# Patient Record
Sex: Male | Born: 1985 | Race: White | Hispanic: No | Smoking: Current every day smoker
Health system: Southern US, Community
[De-identification: ages and names within clinical notes are randomized; demographics above are authoritative.]

## PROBLEM LIST (undated history)

## (undated) HISTORY — PX: TIBIA FRACTURE SURGERY: SHX806

---

## 2019-04-24 ENCOUNTER — Ambulatory Visit (HOSPITAL_COMMUNITY)
Admission: EM | Admit: 2019-04-24 | Discharge: 2019-04-24 | Disposition: A | Discharge status: Home / Self Care | Payer: Worker's Compensation | Attending: Family Medicine | Admitting: Family Medicine

## 2019-04-24 ENCOUNTER — Ambulatory Visit (INDEPENDENT_AMBULATORY_CARE_PROVIDER_SITE_OTHER): Payer: Worker's Compensation

## 2019-04-24 ENCOUNTER — Encounter (HOSPITAL_COMMUNITY): Payer: Self-pay

## 2019-04-24 ENCOUNTER — Other Ambulatory Visit: Payer: Self-pay

## 2019-04-24 DIAGNOSIS — S93402A Sprain of unspecified ligament of left ankle, initial encounter: Secondary | ICD-10-CM

## 2019-04-24 MED ORDER — NAPROXEN 500 MG PO TABS: 500.0000 mg | ORAL_TABLET | Freq: Two times a day (BID) | ORAL | 0 refills | Status: AC

## 2019-04-24 NOTE — Discharge Instructions (Signed)
No fracture on xray today, obviously sprained.  Ice, elevation, ace wrap, naproxen twice a day for pain control.  May use crutches as needed.  Activity as tolerated.  Please follow up with sports medicine as needed for persistent symptoms.

## 2019-04-24 NOTE — ED Triage Notes (Signed)
Pt presents to UC w/ left ankle injury. Pt reports he "rolled" it yesterday afternoon.

## 2019-04-24 NOTE — ED Provider Notes (Signed)
[facility]    CSN: [id number] Arrival date & time: 04/24/19  1302      History   Chief Complaint Chief Complaint  Patient presents with   ankle injury    HPI Travis Snow is a 33 y.o. male.   Travis Snow presents with complaints of left ankle pain swelling and bruising after he twisted it as he stepped down off of a lift yesterday at work. Pain and swelling since. No numbness or tingling. Burning sensation. Has applied ice and elevated it, taken ibuprofen, which hasn't helped. Pain 7/10 in severity. Worse with weight bearing. Denies any previous left ankle or foot injury. History of surgery to right ankle. Without contributing medical history.      ROS per HPI, negative if not otherwise mentioned.      History reviewed. No pertinent past medical history.  There are no active problems to display for this patient.   Past Surgical History:  Procedure Laterality Date   TIBIA FRACTURE SURGERY     right       Home Medications    Prior to Admission medications   Medication Sig Start Date End Date Taking? Authorizing Provider  omeprazole (PRILOSEC) 20 MG capsule Take 20 mg by mouth daily.   Yes [provider]  naproxen (NAPROSYN) 500 MG tablet Take 1 tablet (500 mg total) by mouth 2 (two) times daily. 04/24/19   Zigmund Gottron, NP    Family History History reviewed. No pertinent family history.  Social History Social History   Tobacco Use   Smoking status: Current Every Day Smoker    Packs/day: 0.50    Types: Cigarettes   Smokeless tobacco: Never Used  Substance Use Topics   Alcohol use: Never    Frequency: Never   Drug use: Never     Allergies   Patient has no known allergies.   Review of Systems Review of Systems   Physical Exam Triage Vital Signs ED Triage Vitals  Enc Vitals Group     BP 04/24/19 1325 (!) 162/101     Pulse Rate 04/24/19 1325 80     Resp 04/24/19 1325 16     Temp 04/24/19 1325 98.1 F (36.7  C)     Temp Source 04/24/19 1325 Temporal     SpO2 04/24/19 1325 99 %     Weight --      Height --      Head Circumference --      Peak Flow --      Pain Score 04/24/19 1338 7     Pain Loc --      Pain Edu? --      Excl. in [place]? --    No data found.  Updated Vital Signs BP (!) 162/101 (BP Location: Left Arm)    Pulse 80    Temp 98.1 F (36.7 C) (Temporal)    Resp 16    SpO2 99%    Physical Exam Constitutional:      Appearance: He is well-developed.  Cardiovascular:     Rate and Rhythm: Normal rate.  Pulmonary:     Effort: Pulmonary effort is normal.  Musculoskeletal:     Left ankle: He exhibits decreased range of motion, swelling and ecchymosis. He exhibits no laceration and normal pulse. Tenderness. Lateral malleolus tenderness found. Achilles tendon normal.       Feet:     Comments: Swelling and bruising about the anterior as well as primarily the lateral left ankle; tenderness to  lateral malleolus and surrounding soft tissue; strong pedal pulse; cap refill < 2 seconds; moving toes without difficulty; pain with ROM to left ankle   Skin:    General: Skin is warm and dry.  Neurological:     Mental Status: He is alert and oriented to person, place, and time.      UC Treatments / Results  Labs (all labs ordered are listed, but only abnormal results are displayed) Labs Reviewed - No data to display  EKG   Radiology Dg Ankle Complete Left  Result Date: 04/24/2019 CLINICAL DATA:  Twisting injury and fall, pain the lateral malleolus EXAM: LEFT ANKLE COMPLETE - 3+ VIEW COMPARISON:  None. FINDINGS: No fracture or dislocation of the left ankle. 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 There is an incidental small bone island of the distal left tibia. Soft tissue edema overlying the lateral malleolus. IMPRESSION: No fracture or dislocation of the left ankle. Soft tissue edema overlying the lateral malleolus. Electronically Signed   By: Lauralyn Primes M.D.   On: 04/24/2019 14:31     Procedures Procedures (including critical care time)  Medications Ordered in UC Medications - No data to display  Initial Impression / Assessment and Plan / UC Course  I have reviewed the triage vital signs and the nursing notes.  Pertinent labs & imaging results that were available during my care of the patient were reviewed by me and considered in my medical decision making (see chart for details).     No acute findings on xray. Consistent with sprain. Ace wrap provided as patient without insurance and concerned about costs, discussed use of stirrup style brace for further support. Ice, elevation, nsaids. Activity as tolerated. Encouraged follow up with sports medicine as needed. Patient verbalized understanding and agreeable to plan.    Final Clinical Impressions(s) / UC Diagnoses   Final diagnoses:  Sprain of left ankle, unspecified ligament, initial encounter     Discharge Instructions     No fracture on xray today, obviously sprained.  Ice, elevation, ace wrap, naproxen twice a day for pain control.  May use crutches as needed.  Activity as tolerated.  Please follow up with sports medicine as needed for persistent symptoms.     ED Prescriptions    Medication Sig Dispense Auth. Provider   naproxen (NAPROSYN) 500 MG tablet Take 1 tablet (500 mg total) by mouth 2 (two) times daily. 30 tablet Georgetta Haber, NP     PDMP not reviewed this encounter.   Georgetta Haber, NP 04/24/19 1529

## 2019-05-30 ENCOUNTER — Other Ambulatory Visit: Payer: Self-pay

## 2019-05-30 DIAGNOSIS — Z20822 Contact with and (suspected) exposure to covid-19: Secondary | ICD-10-CM

## 2019-06-01 LAB — NOVEL CORONAVIRUS, NAA: SARS-CoV-2, NAA: NOT DETECTED

## 2020-09-28 IMAGING — DX DG ANKLE COMPLETE 3+V*L*
3 series · 3 of 3 positions shown · non-contrast
Comparison: None.

CLINICAL DATA: Twisting injury and fall, pain the lateral malleolus

EXAM:
LEFT ANKLE COMPLETE - 3+ VIEW

[ankle ap]
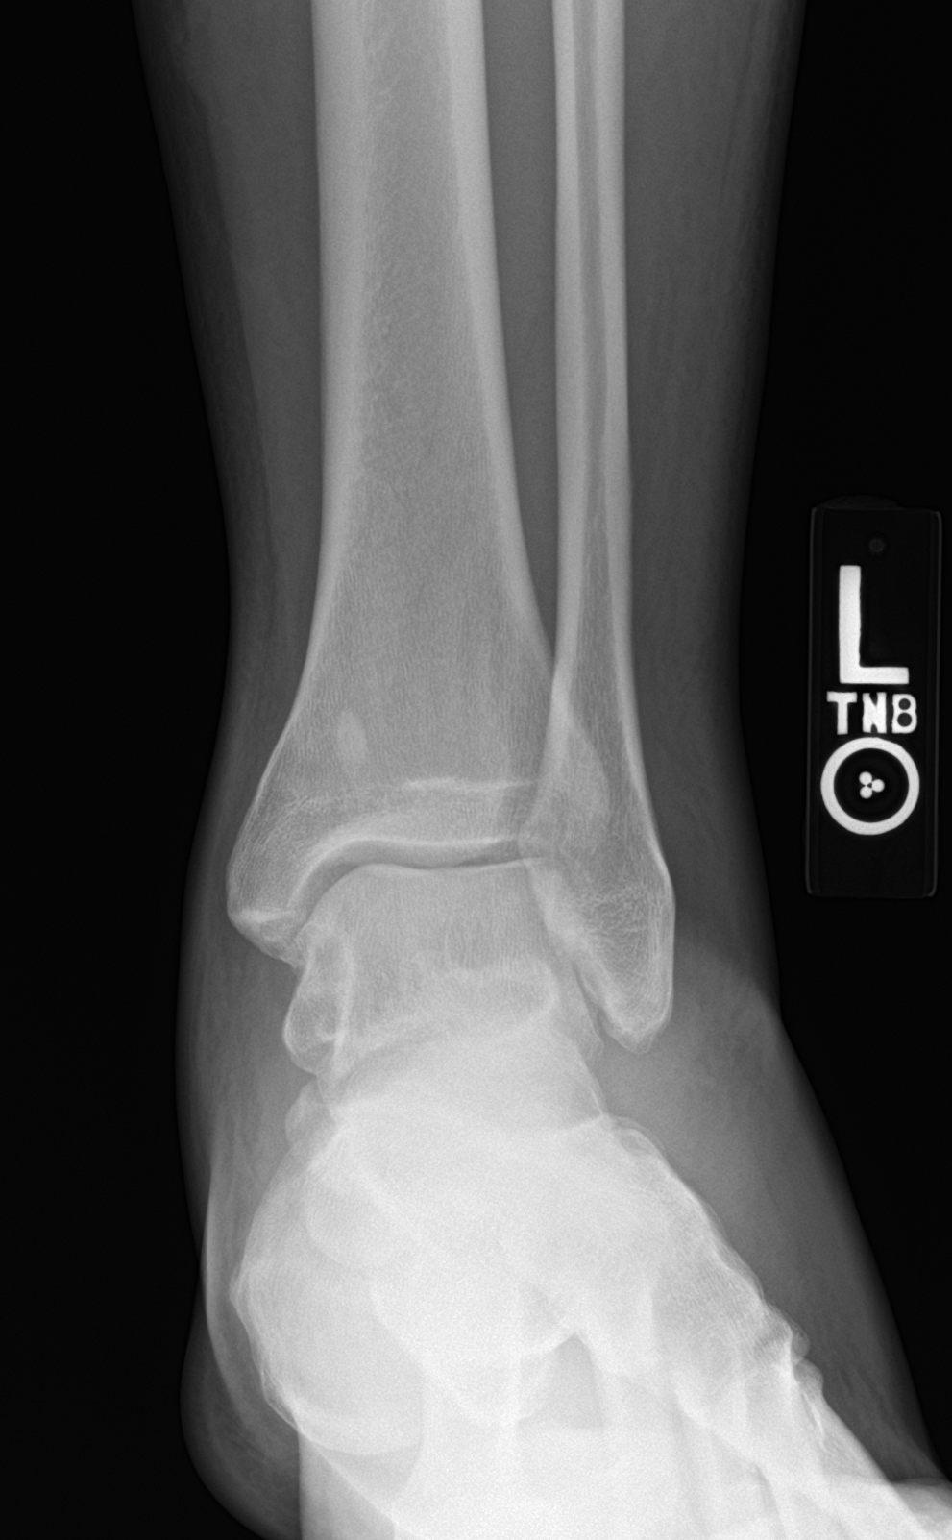

[ankle obl]
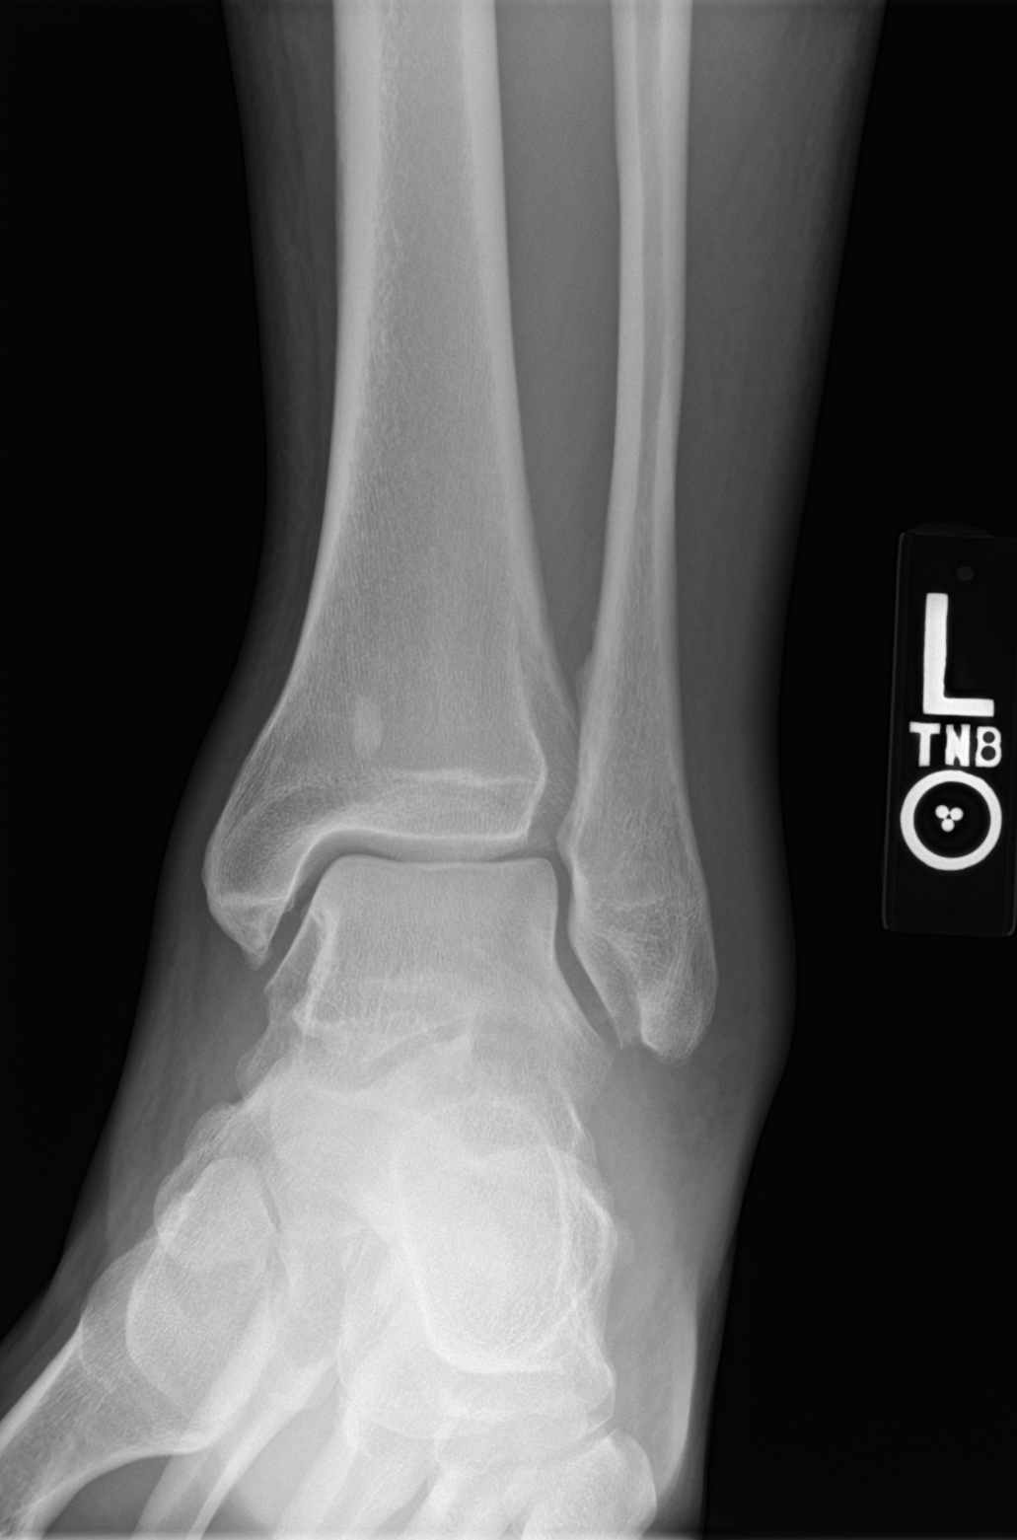

[ankle lat]
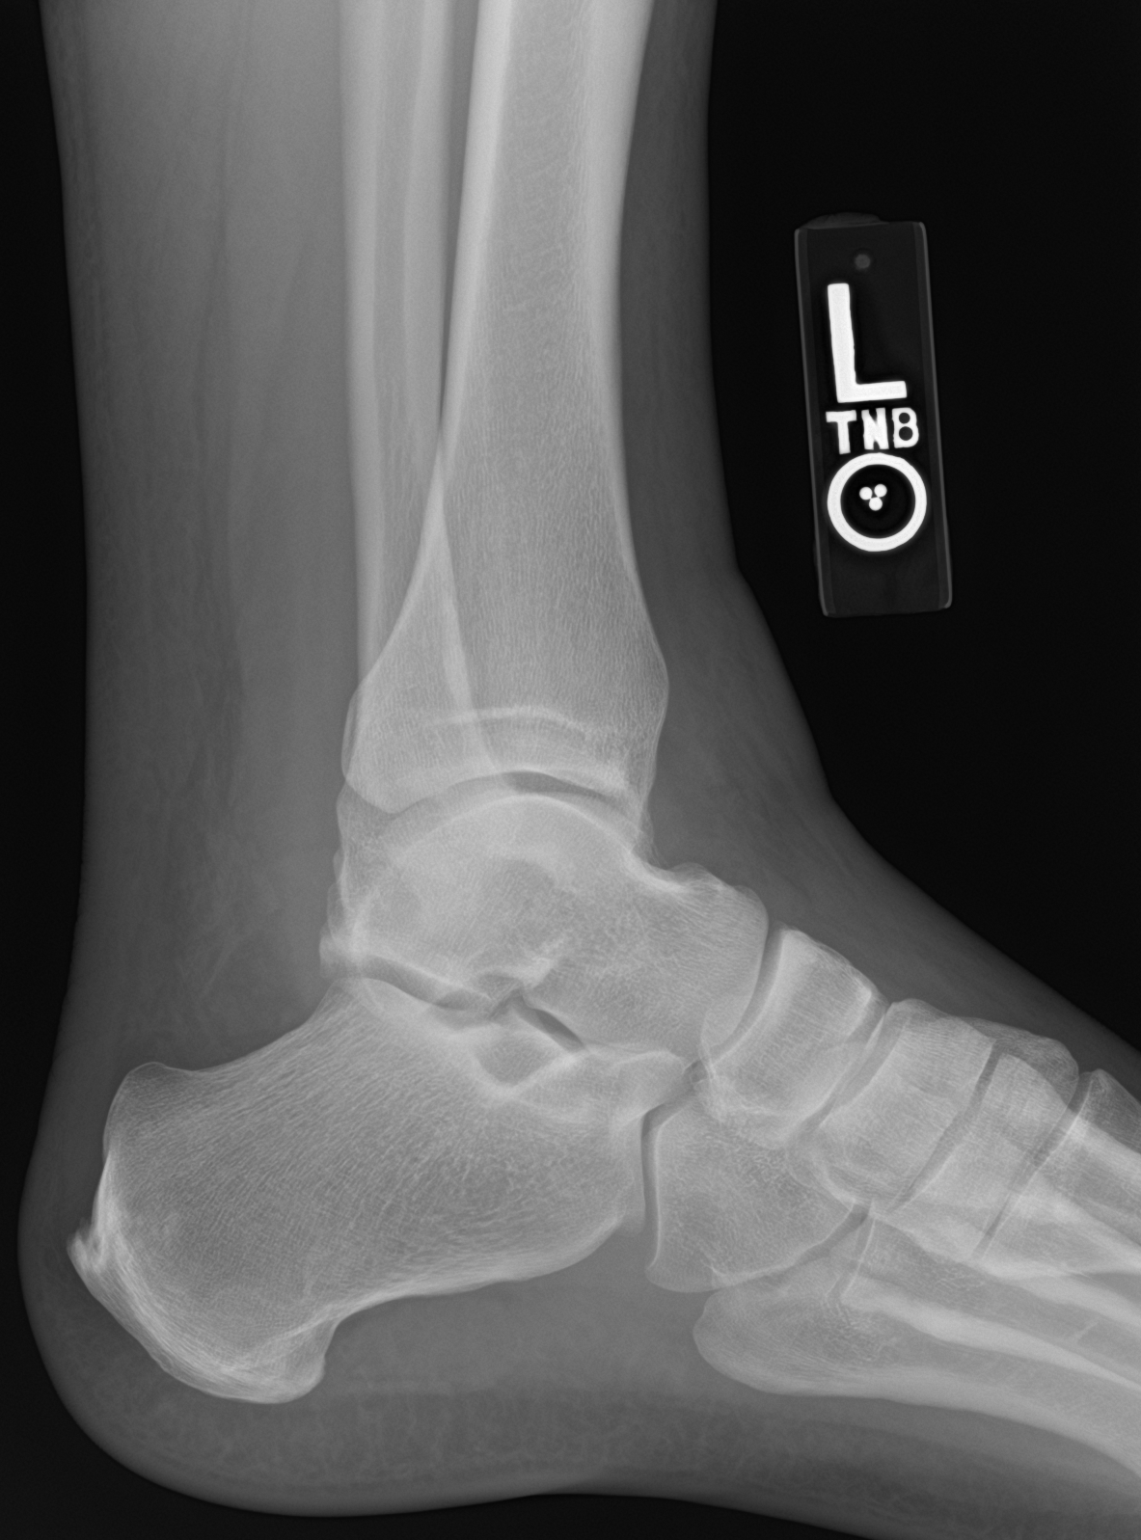

[3 of 3 positions shown; findings below may reference images not displayed]

FINDINGS: No fracture or dislocation of the left ankle. There is minimal ankle
mortise arthrosis. There is an incidental small bone island of the
distal left tibia. Soft tissue edema overlying the lateral
malleolus.
IMPRESSION: No fracture or dislocation of the left ankle. Soft tissue edema
overlying the lateral malleolus.
